# Patient Record
Sex: Female | Born: 1990
Health system: Southern US, Community
[De-identification: ages and names within clinical notes are randomized; demographics above are authoritative.]

## PROBLEM LIST (undated history)

## (undated) DIAGNOSIS — K219 Gastro-esophageal reflux disease without esophagitis: Secondary | ICD-10-CM

## (undated) DIAGNOSIS — E785 Hyperlipidemia, unspecified: Secondary | ICD-10-CM

## (undated) DIAGNOSIS — E039 Hypothyroidism, unspecified: Secondary | ICD-10-CM

## (undated) DIAGNOSIS — K5909 Other constipation: Secondary | ICD-10-CM

## (undated) DIAGNOSIS — D649 Anemia, unspecified: Secondary | ICD-10-CM

## (undated) HISTORY — DX: Anemia, unspecified: D64.9

## (undated) HISTORY — DX: Hyperlipidemia, unspecified: E78.5

## (undated) HISTORY — PX: WISDOM TOOTH EXTRACTION: SHX21

## (undated) HISTORY — DX: Other constipation: K59.09

## (undated) HISTORY — PX: TONSILLECTOMY: SUR1361

## (undated) HISTORY — DX: Gastro-esophageal reflux disease without esophagitis: K21.9

## (undated) HISTORY — DX: Hypothyroidism, unspecified: E03.9

---

## 2019-04-20 ENCOUNTER — Encounter: Payer: Self-pay | Admitting: Gastroenterology

## 2019-04-26 ENCOUNTER — Other Ambulatory Visit: Payer: Self-pay

## 2019-04-26 ENCOUNTER — Ambulatory Visit (INDEPENDENT_AMBULATORY_CARE_PROVIDER_SITE_OTHER): Payer: Commercial Managed Care - PPO | Admitting: Gastroenterology

## 2019-04-26 ENCOUNTER — Encounter (INDEPENDENT_AMBULATORY_CARE_PROVIDER_SITE_OTHER): Payer: Self-pay

## 2019-04-26 ENCOUNTER — Encounter: Payer: Self-pay | Admitting: Gastroenterology

## 2019-04-26 VITALS — BP 96/60 | HR 84 | Temp 98.7°F | Ht 64.75 in | Wt 132.1 lb

## 2019-04-26 DIAGNOSIS — Z800 Family history of malignant neoplasm of digestive organs: Secondary | ICD-10-CM

## 2019-04-26 NOTE — Patient Instructions (Signed)
If you are age 29 or younger, your body mass index should be between 19-25. Your Body mass index is 22.16 kg/m. If this is out of the aformentioned range listed, please consider follow up with your Primary Care Provider.   You have been referred for Genetic Testing. Their office will contact you with an appointment.   Thank you for choosing me and [facility].  Dr. Ardis Hughs

## 2019-04-26 NOTE — Progress Notes (Signed)
HPI: This is a very pleasant 28 year old woman who was referred to me by Fanny Bien, MD  to evaluate family history of colon cancer.    Chief complaint is family history of colon cancer  3 of her grandparents had colon cancer in their 33s.  Her brother has had colon polyps in his 60s.  She herself underwent a colonoscopy about 10 years ago and was told it was normal.  She has very mild chronic constipation for which MiraLAX on a daily basis works quite well.  Stable weight, no overt GI bleeding  Review of systems: Pertinent positive and negative review of systems were noted in the above HPI section. All other review negative.   Past Medical History:  Diagnosis Date  . Anemia   . Chronic constipation   . GERD (gastroesophageal reflux disease)   . HLD (hyperlipidemia)   . Hypothyroidism     Past Surgical History:  Procedure Laterality Date  . TONSILLECTOMY    . WISDOM TOOTH EXTRACTION      Current Outpatient Medications  Medication Sig Dispense Refill  . JUNEL FE 1.5/30 1.5-30 MG-MCG tablet Take 1 tablet by mouth daily.    Marland Kitchen levothyroxine (SYNTHROID) 50 MCG tablet Take 50 mcg by mouth daily before breakfast.    . Multiple Vitamin (MULTIVITAMIN) tablet Take 1 tablet by mouth daily.    . polyethylene glycol (MIRALAX / GLYCOLAX) 17 g packet Take 17 g by mouth daily as needed.     No current facility-administered medications for this visit.     Allergies as of 04/26/2019  . (No Known Allergies)    Family History  Problem Relation Age of Onset  . Hyperlipidemia Mother   . Hyperlipidemia Father   . Hyperlipidemia Brother   . Ovarian cancer Paternal Aunt   . Colon cancer Maternal Grandmother   . Colon cancer Paternal Grandmother   . Colon cancer Paternal Grandfather     Social History   Socioeconomic History  . Marital status: Married    Spouse name: Not on file  . Number of children: Not on file  . Years of education: Not on file  . Highest education level:  Not on file  Occupational History  . Not on file  Social Needs  . Financial resource strain: Not on file  . Food insecurity    Worry: Not on file    Inability: Not on file  . Transportation needs    Medical: Not on file    Non-medical: Not on file  Tobacco Use  . Smoking status: Never Smoker  . Smokeless tobacco: Never Used  Substance and Sexual Activity  . Alcohol use: Never    Frequency: Never  . Drug use: Never  . Sexual activity: Yes  Lifestyle  . Physical activity    Days per week: Not on file    Minutes per session: Not on file  . Stress: Not on file  Relationships  . Social Herbalist on phone: Not on file    Gets together: Not on file    Attends religious service: Not on file    Active member of club or organization: Not on file    Attends meetings of clubs or organizations: Not on file    Relationship status: Not on file  . Intimate partner violence    Fear of current or ex partner: Not on file    Emotionally abused: Not on file    Physically abused: Not on file  Forced sexual activity: Not on file  Other Topics Concern  . Not on file  Social History Narrative  . Not on file     Physical Exam: Temp 98.7 F (37.1 C)   Ht 5' 4.75" (1.645 m) Comment: height measured without shoes  Wt 132 lb 2 oz (59.9 kg)   BMI 22.16 kg/m  Constitutional: generally well-appearing Psychiatric: alert and oriented x3 Eyes: extraocular movements intact Mouth: oral pharynx moist, no lesions Neck: supple no lymphadenopathy Cardiovascular: heart regular rate and rhythm Lungs: clear to auscultation bilaterally Abdomen: soft, nontender, nondistended, no obvious ascites, no peritoneal signs, normal bowel sounds Extremities: no lower extremity edema bilaterally Skin: no lesions on visible extremities   Assessment and plan: 28 y.o. female with family history of colon cancer  She actually had a panel of genetic tests through her gynecologist recently and was told  everything looked fine.  I am not sure if she is personally at increased risk for colon cancer given her family history.  I am going to refer her to genetic counselors to try to help determine the answer to that.  She knows when she has that appointment she should bring her the test results from her gynecologist ordered genetic tests.    Please see the "Patient Instructions" section for addition details about the plan.   Rob Bunting, MD [facility] 04/26/2019, 2:45 PM  Cc: Lewis Moccasin, MD

## 2019-04-28 ENCOUNTER — Telehealth: Payer: Self-pay | Admitting: Genetic Counselor

## 2019-04-28 NOTE — Telephone Encounter (Signed)
I cld and scheduled Holly Terry to see Sophonie for genetic counseling on 12/31 at 3pm. Pt aware to arrive 15 minutes early.

## 2019-05-05 ENCOUNTER — Telehealth: Payer: Self-pay

## 2019-05-05 ENCOUNTER — Telehealth: Payer: Self-pay | Admitting: Licensed Clinical Social Worker

## 2019-05-05 ENCOUNTER — Encounter: Payer: Self-pay | Admitting: Licensed Clinical Social Worker

## 2019-05-05 DIAGNOSIS — Z1379 Encounter for other screening for genetic and chromosomal anomalies: Secondary | ICD-10-CM | POA: Insufficient documentation

## 2019-05-05 NOTE — Telephone Encounter (Signed)
The pt has bene advised and recall in Epic for appt at 39.

## 2019-05-05 NOTE — Telephone Encounter (Signed)
Patient called with questions about upcoming genetic counseling appointment scheduled 05/18/2019. She had genetic testing recently through [facility] and wanted to know if there was anything else she needed to be tested for. She sent a copy and the results were reviewed, snip below for reference. Patient was notified that we could do further testing but that what she has had was fairly comprehensive and did cover genes associated with colon polyps/cancer. She wished to cancel her appointments with [place].

## 2019-05-05 NOTE — Telephone Encounter (Signed)
-----   Message from Milus Banister, MD sent at 05/05/2019 10:09 AM EST ----- Regarding: RE: Olevia Bowens, Thanks. I'll plan on colonoscopy when she turns 39  but not sooner than that.  Thanks  Lillionna Nabi, Can you contact her to let her know that she needs colonoscopy at age 28, please turn this to a phone note as well for record keeping.  Thanks ----- Message ----- From: Eyvonne Mechanic Sent: 05/05/2019  10:01 AM EST To: Milus Banister, MD Subject: Genetics                                       Hi Dr. Ardis Hughs!  This patient was scheduled to see [place] and contacted me with questions about her appt-- she actually already had genetic testing through [facility] in November of this year that was negative which I reviewed, and let her know we wouldn't need to do any other testing. She wanted me to let you know that is why we are cancelling her appointment with [place].  Thanks!! Denton Ar

## 2019-05-18 ENCOUNTER — Other Ambulatory Visit: Payer: Commercial Managed Care - PPO

## 2019-05-18 ENCOUNTER — Encounter: Payer: Commercial Managed Care - PPO | Admitting: Genetic Counselor

## 2019-07-07 MED FILL — LARIN FE 1.5-30 TABLET: 1.5-30 | 84 days supply | Qty: 84 | Fill #0

## 2019-07-14 DIAGNOSIS — E782 Mixed hyperlipidemia: Secondary | ICD-10-CM | POA: Diagnosis not present

## 2019-07-14 DIAGNOSIS — D649 Anemia, unspecified: Secondary | ICD-10-CM | POA: Diagnosis not present

## 2019-07-14 DIAGNOSIS — R5383 Other fatigue: Secondary | ICD-10-CM | POA: Diagnosis not present

## 2019-07-14 DIAGNOSIS — E559 Vitamin D deficiency, unspecified: Secondary | ICD-10-CM | POA: Diagnosis not present

## 2019-07-18 DIAGNOSIS — Z3041 Encounter for surveillance of contraceptive pills: Secondary | ICD-10-CM | POA: Diagnosis not present

## 2019-07-18 DIAGNOSIS — D509 Iron deficiency anemia, unspecified: Secondary | ICD-10-CM | POA: Diagnosis not present

## 2019-07-18 DIAGNOSIS — E039 Hypothyroidism, unspecified: Secondary | ICD-10-CM | POA: Diagnosis not present

## 2019-07-18 DIAGNOSIS — E782 Mixed hyperlipidemia: Secondary | ICD-10-CM | POA: Diagnosis not present

## 2019-07-21 MED FILL — LEVOTHYROXINE 50 MCG TABLET: 50 | 90 days supply | Qty: 90 | Fill #0

## 2019-10-03 MED FILL — BLISOVI FE 1.5/30 1.5-30 MG: 1.5-30 | 84 days supply | Qty: 84 | Fill #1

## 2019-10-30 DIAGNOSIS — Z0000 Encounter for general adult medical examination without abnormal findings: Secondary | ICD-10-CM | POA: Diagnosis not present

## 2019-10-30 DIAGNOSIS — Z0184 Encounter for antibody response examination: Secondary | ICD-10-CM | POA: Diagnosis not present

## 2019-11-01 DIAGNOSIS — Z23 Encounter for immunization: Secondary | ICD-10-CM | POA: Diagnosis not present

## 2019-11-01 DIAGNOSIS — E039 Hypothyroidism, unspecified: Secondary | ICD-10-CM | POA: Diagnosis not present

## 2019-11-01 DIAGNOSIS — K5900 Constipation, unspecified: Secondary | ICD-10-CM | POA: Diagnosis not present

## 2019-11-01 DIAGNOSIS — E782 Mixed hyperlipidemia: Secondary | ICD-10-CM | POA: Diagnosis not present

## 2019-11-01 DIAGNOSIS — L811 Chloasma: Secondary | ICD-10-CM | POA: Diagnosis not present

## 2019-11-01 DIAGNOSIS — Z0000 Encounter for general adult medical examination without abnormal findings: Secondary | ICD-10-CM | POA: Diagnosis not present

## 2019-11-02 MED FILL — LEVOTHYROXINE 50 MCG TABLET: 50 | 90 days supply | Qty: 90 | Fill #1

## 2019-11-14 DIAGNOSIS — H5213 Myopia, bilateral: Secondary | ICD-10-CM | POA: Diagnosis not present

## 2020-01-30 MED FILL — LEVOTHYROXINE 50 MCG TABLET: 50 | 90 days supply | Qty: 90 | Fill #2

## 2020-02-19 DIAGNOSIS — E782 Mixed hyperlipidemia: Secondary | ICD-10-CM | POA: Diagnosis not present

## 2020-02-19 DIAGNOSIS — E559 Vitamin D deficiency, unspecified: Secondary | ICD-10-CM | POA: Diagnosis not present

## 2020-02-19 DIAGNOSIS — R946 Abnormal results of thyroid function studies: Secondary | ICD-10-CM | POA: Diagnosis not present

## 2020-02-22 ENCOUNTER — Other Ambulatory Visit (HOSPITAL_COMMUNITY): Payer: Self-pay | Admitting: Family Medicine

## 2020-02-22 DIAGNOSIS — K5900 Constipation, unspecified: Secondary | ICD-10-CM | POA: Diagnosis not present

## 2020-02-22 DIAGNOSIS — E782 Mixed hyperlipidemia: Secondary | ICD-10-CM | POA: Diagnosis not present

## 2020-02-22 DIAGNOSIS — E559 Vitamin D deficiency, unspecified: Secondary | ICD-10-CM | POA: Diagnosis not present

## 2020-02-22 DIAGNOSIS — E039 Hypothyroidism, unspecified: Secondary | ICD-10-CM | POA: Diagnosis not present

## 2020-02-22 DIAGNOSIS — L811 Chloasma: Secondary | ICD-10-CM | POA: Diagnosis not present

## 2020-04-01 DIAGNOSIS — Z01419 Encounter for gynecological examination (general) (routine) without abnormal findings: Secondary | ICD-10-CM | POA: Diagnosis not present

## 2020-04-01 DIAGNOSIS — Z6821 Body mass index (BMI) 21.0-21.9, adult: Secondary | ICD-10-CM | POA: Diagnosis not present

## 2020-04-23 MED FILL — LEVOTHYROXINE 50 MCG TABLET: 50 | 90 days supply | Qty: 90 | Fill #3

## 2020-05-14 ENCOUNTER — Ambulatory Visit (INDEPENDENT_AMBULATORY_CARE_PROVIDER_SITE_OTHER): Payer: 59 | Admitting: Nurse Practitioner

## 2020-05-14 ENCOUNTER — Encounter: Payer: Self-pay | Admitting: Nurse Practitioner

## 2020-05-14 VITALS — BP 86/60 | HR 72 | Ht 64.75 in | Wt 134.0 lb

## 2020-05-14 DIAGNOSIS — R140 Abdominal distension (gaseous): Secondary | ICD-10-CM | POA: Diagnosis not present

## 2020-05-14 DIAGNOSIS — K5900 Constipation, unspecified: Secondary | ICD-10-CM

## 2020-05-14 NOTE — Progress Notes (Signed)
I agree with the above note, plan 

## 2020-05-14 NOTE — Patient Instructions (Signed)
If you are age 30 or older, your body mass index should be between 23-30. Your Body mass index is 22.47 kg/m. If this is out of the aforementioned range listed, please consider follow up with your Primary Care Provider.  If you are age 47 or younger, your body mass index should be between 19-25. Your Body mass index is 22.47 kg/m. If this is out of the aformentioned range listed, please consider follow up with your Primary Care Provider.   Please purchase the following medications over the counter and take as directed:      1. Miralax before bedtime 2. Dulcolax 1 tablet 3 nights a week as needed.  Please call our office( or send MyChart message)  in 2 weeks with an update or earlier is symptoms worsen.    We will request your recent blood work from your primary care provider.  It was great seeing you today!  Thank you for entrusting me with your care and choosing [facility].  Alcide Evener, NP

## 2020-05-14 NOTE — Progress Notes (Addendum)
05/14/2020 Holly Terry [id number] [birth year]   Chief Complaint: Chronic constipation  History of Present Illness:  Holly Terry is a 29 year old female with a past medical history of chronic constipation.  She presents today for further evaluation.  She is passing a small brown formed bowel movement most days and passing a larger well-formed stool 2 or 3 times weekly when she actually feels emptied.  She complains of having abdominal bloat upon awakening in the morning on a daily basis.  She has generalized abdominal discomfort which sometimes feels like she has rocks inside.  She had severe centralized abdominal pain almost buckled her over about 1 week ago without recurrence.  She infrequently has left lower quadrant discomfort if she strains to pass a BM.  She was previously taking MiraLAX 1 capful once daily then increase to twice daily without improvement.  She felt more bloated when taking MiraLAX twice daily so she went back to once daily dosing.  She reported taking Linzess several years ago which resulted in 3 hours of nonstop diarrhea and it was not covered by her insurance therefore she is not interested in retrying this medication.  She thinks she tried Amitiza 10 years ago without improvement.  No rectal bleeding.  No mucus per the rectum.  She eats a high-fiber mostly vegan diet.  She drinks 80 ounces of water daily.  He was last seen by her gynecologist in the fall 2021 reported pelvic exam was normal at that time.  Her brother was diagnosed with precancerous colon polyps in his early 58s.  Three grandparents were diagnosed with colon cancer in their 33s.  She reported undergoing a colonoscopy about 10 years ago which was normal.  She was previously seen in the office by Dr. Christella Hartigan 04/26/2019, at that time he recommended a colonoscopy at the age of 77.  She underwent genetic testing through her gynecologist which was negative.  She questions if she should undergo a colonoscopy at this  time.  No GERD symptoms.  She reported undergoing laboratory studies a few months ago included a TSH level by her PCP which were normal.  I will request a copy of these lab results for further review.   Current Outpatient Medications on File Prior to Visit  Medication Sig Dispense Refill  . levothyroxine (SYNTHROID) 50 MCG tablet Take 50 mcg by mouth daily before breakfast.    . polyethylene glycol (MIRALAX / GLYCOLAX) 17 g packet Take 17 g by mouth daily as needed.     No current facility-administered medications on file prior to visit.   No Known Allergies  Current Medications, Allergies, Past Medical History, Past Surgical History, Family History and Social History were reviewed in [name] record.   Review of Systems:   Constitutional: Negative for fever, sweats, chills or weight loss.  Respiratory: Negative for shortness of breath.   Cardiovascular: Negative for chest pain, palpitations and leg swelling.  Gastrointestinal: See HPI.  Musculoskeletal: Negative for back pain or muscle aches.  Neurological: Negative for dizziness, headaches or paresthesias.    Physical Exam: BP (!) 86/60 (BP Location: Left Arm, Patient Position: Sitting, Cuff Size: Normal)   Pulse 72   Ht 5' 4.75" (1.645 m)   Wt 134 lb (60.8 kg)   LMP 04/16/2020   BMI 22.47 kg/m  General: Well developed 29 year old female in no acute distress. Head: Normocephalic and atraumatic. Eyes: No scleral icterus. Conjunctiva pink . Ears: Normal auditory acuity. Mouth: Dentition intact. No ulcers or  lesions.  Lungs: Clear throughout to auscultation. Heart: Regular rate and rhythm, no murmur. Abdomen: Soft, nontender and nondistended. No masses or hepatomegaly. Normal bowel sounds x 4 quadrants.  Rectal: Deferred. Musculoskeletal: Symmetrical with no gross deformities. Extremities: No edema. Neurological: Alert oriented x 4. No focal deficits.  Psychological: Alert and cooperative. Normal  mood and affect  Assessment and Recommendations:  96.  30 year old female with chronic constipation and abdominal bloat.  -She does not wish to try low-dose Linzess or Trulance -Continue MiraLAX once daily and add Dulcolax 1-2 tabs every third night. -Patient to call me in 2 weeks with an update -Discussed checking TTG and IgA level with next blood draw  2.  Generalized periumbilical pain.  Negative abdominal exam today.  No current abdominal pain. -Discussed scheduling abdominal/pelvic CT scan if significant central abdominal pain or lower abdominal pain occurs -Request copy of most recent CBC, CMP and TSH from PCPs office  3.Family history of colon cancer (3 grandparents in their 8's).  Brother with history of colon polyps in his early 36's. Normal colonoscopy 10 years ago.  -Colonoscopy recommended at age 63 -Patient to schedule a follow up appointment with Dr. Christella Hartigan if her symptoms persist as she wishes to schedule a near future colonoscopy    ADDENDUM: LABS 02/19/2020 RECEIVED: TSH 1.980. Vitamin D 47.

## 2020-05-18 NOTE — L&D Delivery Note (Signed)
Operative Delivery Note At 8:00 AM a viable female was delivered via Vaginal, [employer], operational).  Presentation: vertex; Position: Right,, Occiput,, Anterior; Station: +2.  Verbal consent: obtained from patient.  Risks and benefits discussed in detail.  Risks include, but are not limited to the risks of anesthesia, bleeding, infection, damage to maternal tissues, fetal cephalhematoma.  There is also the risk of inability to effect vaginal delivery of the head, or shoulder dystocia that cannot be resolved by established maneuvers, leading to the need for emergency cesarean section.  APGAR: 8, 9; weight  pending.   Placenta status: spontaneously with 3 vessel cord, .   Cord:  with the following complications: none.  Cord pH: not obtained  Anesthesia:  local Instruments: mushroom Episiotomy: Median Lacerations: 2nd degree Suture Repair: 3.0 chromic Est. Blood Loss (mL): 500  Mom to postpartum.  Baby to Couplet care / Skin to Skin.  Jeani Hawking 02/26/2021, 8:25 AM

## 2020-06-26 ENCOUNTER — Other Ambulatory Visit: Payer: Self-pay

## 2020-06-26 ENCOUNTER — Emergency Department (INDEPENDENT_AMBULATORY_CARE_PROVIDER_SITE_OTHER)
Admission: RE | Admit: 2020-06-26 | Discharge: 2020-06-26 | Disposition: A | Discharge status: Home / Self Care | Payer: 59 | Source: Ambulatory Visit

## 2020-06-26 ENCOUNTER — Emergency Department (INDEPENDENT_AMBULATORY_CARE_PROVIDER_SITE_OTHER): Payer: 59

## 2020-06-26 ENCOUNTER — Other Ambulatory Visit: Payer: Self-pay | Admitting: Family Medicine

## 2020-06-26 VITALS — BP 111/73 | HR 82 | Temp 98.3°F | Resp 18

## 2020-06-26 DIAGNOSIS — S86911A Strain of unspecified muscle(s) and tendon(s) at lower leg level, right leg, initial encounter: Secondary | ICD-10-CM | POA: Diagnosis not present

## 2020-06-26 DIAGNOSIS — M25561 Pain in right knee: Secondary | ICD-10-CM

## 2020-06-26 MED ORDER — DICLOFENAC SODIUM 50 MG PO TBEC: 50.0000 mg | DELAYED_RELEASE_TABLET | Freq: Two times a day (BID) | ORAL | 0 refills | Status: DC

## 2020-06-26 MED FILL — DICLOFENAC SOD EC 50 MG TAB: 50 | 10 days supply | Qty: 20 | Fill #0

## 2020-06-26 NOTE — ED Provider Notes (Signed)
[facility]    CSN: [id number] Arrival date & time: 06/26/20  0940      History   Chief Complaint Chief Complaint  Patient presents with  . Knee Pain    RT    HPI Holly Terry is a 30 y.o. female.   HPI  Patient presents today for evaluation of right knee pain related to possible injury while jump roping yesterday.  Patient reports that while jump roping she did come down her right extremity a little harder than normal although denies knowledge of twisting or the sensation of any popping occurring within the knee.  After her workouts she did note mild pain applied ice to the knee and took an ibuprofen.  Upon awakening this morning she is having limited range of motion in the right knee with flexion, extension and some swelling at the patellar tendon. She is here for evaluation of injury.   Past Medical History:  Diagnosis Date  . Anemia   . Chronic constipation   . GERD (gastroesophageal reflux disease)   . HLD (hyperlipidemia)   . Hypothyroidism     Patient Active Problem List   Diagnosis Date Noted  . Constipation 05/14/2020  . Genetic testing 05/05/2019    Past Surgical History:  Procedure Laterality Date  . TONSILLECTOMY    . WISDOM TOOTH EXTRACTION      OB History   No obstetric history on file.      Home Medications    Prior to Admission medications   Medication Sig Start Date End Date Taking? Authorizing Provider  levothyroxine (SYNTHROID) 50 MCG tablet Take 50 mcg by mouth daily before breakfast.    [provider]  polyethylene glycol (MIRALAX / GLYCOLAX) 17 g packet Take 17 g by mouth daily as needed.    [provider]    Family History Family History  Problem Relation Age of Onset  . Hyperlipidemia Mother   . Hyperlipidemia Father   . Throat cancer Father   . Hyperlipidemia Brother   . Ovarian cancer Maternal Aunt   . Colon cancer Maternal Grandmother   . Colon cancer Paternal Grandmother   . Heart disease  Paternal Grandmother   . Colon cancer Paternal Grandfather   . Colon polyps Brother     Social History Social History   Tobacco Use  . Smoking status: Never Smoker  . Smokeless tobacco: Never Used  Vaping Use  . Vaping Use: Never used  Substance Use Topics  . Alcohol use: Never  . Drug use: Never     Allergies   Patient has no known allergies.   Review of Systems Review of Systems Pertinent negatives listed in HPI  Physical Exam Triage Vital Signs ED Triage Vitals  Enc Vitals Group     BP 06/26/20 0950 111/73     Pulse Rate 06/26/20 0950 82     Resp 06/26/20 0950 18     Temp 06/26/20 0950 98.3 F (36.8 C)     Temp Source 06/26/20 0950 Oral     SpO2 06/26/20 0950 100 %     Weight --      Height --      Head Circumference --      Peak Flow --      Pain Score 06/26/20 0952 4     Pain Loc --      Pain Edu? --      Excl. in GC? --    No data found.  Updated Vital Signs BP  111/73 (BP Location: Left Arm)   Pulse 82   Temp 98.3 F (36.8 C) (Oral)   Resp 18   LMP 05/25/2020 (Exact Date)   SpO2 100%   Visual Acuity Right Eye Distance:   Left Eye Distance:   Bilateral Distance:    Right Eye Near:   Left Eye Near:    Bilateral Near:     Physical Exam Constitutional:      Appearance: Normal appearance. She is not toxic-appearing.  HENT:     Head: Normocephalic.  Cardiovascular:     Rate and Rhythm: Normal rate and regular rhythm.  Pulmonary:     Effort: Pulmonary effort is normal.     Breath sounds: Normal breath sounds.  Musculoskeletal:     Right knee: Swelling present. Decreased range of motion. Tenderness present over the patellar tendon. Normal patellar mobility.  Skin:    Capillary Refill: Capillary refill takes less than 2 seconds.  Neurological:     General: No focal deficit present.     Mental Status: She is alert and oriented to person, place, and time.  Psychiatric:        Mood and Affect: Mood normal.        Thought Content: Thought  content normal.        Judgment: Judgment normal.      UC Treatments / Results  Labs (all labs ordered are listed, but only abnormal results are displayed) Labs Reviewed - No data to display  EKG   Radiology DG Knee Complete 4 Views Right  Result Date: 06/26/2020 CLINICAL DATA:  Right knee pain. EXAM: RIGHT KNEE - COMPLETE 4+ VIEW COMPARISON:  None. FINDINGS: No evidence of fracture, dislocation, or joint effusion. No evidence of arthropathy or other focal bone abnormality. Soft tissues are unremarkable. IMPRESSION: Negative. Electronically Signed   By: Irish Lack M.D.   On: 06/26/2020 10:37    Procedures Procedures (including critical care time)  Medications Ordered in UC Medications - No data to display  Initial Impression / Assessment and Plan / UC Course  I have reviewed the triage vital signs and the nursing notes.  Pertinent labs & imaging results that were available during my care of the patient were reviewed by me and considered in my medical decision making (see chart for details).      Patient presents today with right knee injury following a workout routine which she was jumping rope.  Imaging is negative for any effusion or fracture.  Recommended conservative treatment for a right knee strain including RICE and NSAIDs.  Information provided to follow-up with sports medicine if symptoms do not improve completely or if symptoms continue outside of the 5-day mark. Patient verbalized understanding and agreement with today's plan. Final Clinical Impressions(s) / UC Diagnoses   Final diagnoses:  Strain of right knee, initial encounter     Discharge Instructions     Start diclofenac 50 mg twice daily consistently for the next 48 hours, elevate knee , continue ice at least 3-4 times a day, and also recommend wrapping with an Ace wrap or a knee brace for compression to reduce swelling. Symptoms should gradually improve over the course of the next 3 to 5 days.  If  symptoms have not improved or any point worsen would recommend following up with our sports medicine provider Dr. Jordan Likes, contact information listed on discharge summary.    ED Prescriptions    Medication Sig Dispense Auth. Provider   diclofenac (VOLTAREN) 50 MG EC tablet Take  1 tablet (50 mg total) by mouth 2 (two) times daily. 20 tablet Bing Neighbors, FNP     PDMP not reviewed this encounter.   Bing Neighbors, FNP 06/26/20 1056

## 2020-06-26 NOTE — Discharge Instructions (Addendum)
Start diclofenac 50 mg twice daily consistently for the next 48 hours, elevate knee , continue ice at least 3-4 times a day, and also recommend wrapping with an Ace wrap or a knee brace for compression to reduce swelling. Symptoms should gradually improve over the course of the next 3 to 5 days.  If symptoms have not improved or any point worsen would recommend following up with our sports medicine provider Dr. Jordan Likes, contact information listed on discharge summary.

## 2020-06-26 NOTE — ED Triage Notes (Signed)
Pt c/o RT knee pain since last night. Says she was jump roping last night. Woke up to knee swelling/stiffness and trouble weight bearing. Pain 4/10 Ice and Ibuprofen last night.

## 2020-07-18 DIAGNOSIS — N911 Secondary amenorrhea: Secondary | ICD-10-CM | POA: Diagnosis not present

## 2020-07-23 ENCOUNTER — Other Ambulatory Visit (HOSPITAL_COMMUNITY): Payer: Self-pay | Admitting: Family Medicine

## 2020-07-23 MED FILL — LEVOTHYROXINE 50 MCG TABLET: 50 | 90 days supply | Qty: 90 | Fill #0

## 2020-07-29 DIAGNOSIS — Z3481 Encounter for supervision of other normal pregnancy, first trimester: Secondary | ICD-10-CM | POA: Diagnosis not present

## 2020-07-29 DIAGNOSIS — Z31430 Encounter of female for testing for genetic disease carrier status for procreative management: Secondary | ICD-10-CM | POA: Diagnosis not present

## 2020-07-29 DIAGNOSIS — Z3685 Encounter for antenatal screening for Streptococcus B: Secondary | ICD-10-CM | POA: Diagnosis not present

## 2020-08-08 DIAGNOSIS — Z113 Encounter for screening for infections with a predominantly sexual mode of transmission: Secondary | ICD-10-CM | POA: Diagnosis not present

## 2020-08-08 DIAGNOSIS — Z3400 Encounter for supervision of normal first pregnancy, unspecified trimester: Secondary | ICD-10-CM | POA: Diagnosis not present

## 2020-08-09 ENCOUNTER — Other Ambulatory Visit (HOSPITAL_BASED_OUTPATIENT_CLINIC_OR_DEPARTMENT_OTHER): Payer: Self-pay

## 2020-08-20 DIAGNOSIS — Z3481 Encounter for supervision of other normal pregnancy, first trimester: Secondary | ICD-10-CM | POA: Diagnosis not present

## 2020-08-20 DIAGNOSIS — Z3682 Encounter for antenatal screening for nuchal translucency: Secondary | ICD-10-CM | POA: Diagnosis not present

## 2020-08-20 DIAGNOSIS — Z3A12 12 weeks gestation of pregnancy: Secondary | ICD-10-CM | POA: Diagnosis not present

## 2020-10-15 DIAGNOSIS — Z3400 Encounter for supervision of normal first pregnancy, unspecified trimester: Secondary | ICD-10-CM | POA: Diagnosis not present

## 2020-10-15 DIAGNOSIS — Z363 Encounter for antenatal screening for malformations: Secondary | ICD-10-CM | POA: Diagnosis not present

## 2020-10-15 DIAGNOSIS — Z3A20 20 weeks gestation of pregnancy: Secondary | ICD-10-CM | POA: Diagnosis not present

## 2020-10-21 ENCOUNTER — Other Ambulatory Visit (HOSPITAL_COMMUNITY): Payer: Self-pay

## 2020-10-21 MED FILL — Levothyroxine Sodium Tab 50 MCG: ORAL | 90 days supply | Qty: 90 | Fill #0 | Status: AC

## 2020-11-04 DIAGNOSIS — Z0000 Encounter for general adult medical examination without abnormal findings: Secondary | ICD-10-CM | POA: Diagnosis not present

## 2020-11-06 DIAGNOSIS — N3000 Acute cystitis without hematuria: Secondary | ICD-10-CM | POA: Diagnosis not present

## 2020-11-06 DIAGNOSIS — Z0000 Encounter for general adult medical examination without abnormal findings: Secondary | ICD-10-CM | POA: Diagnosis not present

## 2020-11-13 DIAGNOSIS — Z3402 Encounter for supervision of normal first pregnancy, second trimester: Secondary | ICD-10-CM | POA: Diagnosis not present

## 2020-11-13 DIAGNOSIS — Z3A24 24 weeks gestation of pregnancy: Secondary | ICD-10-CM | POA: Diagnosis not present

## 2020-11-13 DIAGNOSIS — Z362 Encounter for other antenatal screening follow-up: Secondary | ICD-10-CM | POA: Diagnosis not present

## 2020-12-02 DIAGNOSIS — Z3A27 27 weeks gestation of pregnancy: Secondary | ICD-10-CM | POA: Diagnosis not present

## 2020-12-02 DIAGNOSIS — Z3400 Encounter for supervision of normal first pregnancy, unspecified trimester: Secondary | ICD-10-CM | POA: Diagnosis not present

## 2020-12-02 DIAGNOSIS — Z3480 Encounter for supervision of other normal pregnancy, unspecified trimester: Secondary | ICD-10-CM | POA: Diagnosis not present

## 2020-12-02 DIAGNOSIS — Z23 Encounter for immunization: Secondary | ICD-10-CM | POA: Diagnosis not present

## 2021-01-23 ENCOUNTER — Other Ambulatory Visit (HOSPITAL_COMMUNITY): Payer: Self-pay

## 2021-01-23 MED ORDER — LEVOTHYROXINE SODIUM 50 MCG PO TABS: ORAL_TABLET | ORAL | 1 refills | Status: DC

## 2021-01-23 MED FILL — Levothyroxine Sodium Tab 50 MCG: ORAL | 90 days supply | Qty: 90 | Fill #0 | Status: AC

## 2021-02-03 DIAGNOSIS — Z3682 Encounter for antenatal screening for nuchal translucency: Secondary | ICD-10-CM | POA: Diagnosis not present

## 2021-02-03 LAB — OB RESULTS CONSOLE GBS: GBS: NEGATIVE

## 2021-02-25 ENCOUNTER — Inpatient Hospital Stay (HOSPITAL_COMMUNITY)
Admission: AD | Admit: 2021-02-25 | Discharge: 2021-02-27 | DRG: 806 | Disposition: A | Discharge status: Home / Self Care | Payer: 59 | Attending: Obstetrics and Gynecology | Admitting: Obstetrics and Gynecology

## 2021-02-25 ENCOUNTER — Encounter (HOSPITAL_COMMUNITY): Payer: Self-pay | Admitting: Obstetrics and Gynecology

## 2021-02-25 ENCOUNTER — Other Ambulatory Visit: Payer: Self-pay

## 2021-02-25 ENCOUNTER — Inpatient Hospital Stay (EMERGENCY_DEPARTMENT_HOSPITAL)
Admission: AD | Admit: 2021-02-25 | Discharge: 2021-02-25 | Disposition: A | Discharge status: Home / Self Care | Payer: 59 | Source: Home / Self Care | Attending: Obstetrics and Gynecology | Admitting: Obstetrics and Gynecology

## 2021-02-25 DIAGNOSIS — O26893 Other specified pregnancy related conditions, third trimester: Secondary | ICD-10-CM | POA: Diagnosis present

## 2021-02-25 DIAGNOSIS — E039 Hypothyroidism, unspecified: Secondary | ICD-10-CM | POA: Diagnosis not present

## 2021-02-25 DIAGNOSIS — D62 Acute posthemorrhagic anemia: Secondary | ICD-10-CM | POA: Diagnosis not present

## 2021-02-25 DIAGNOSIS — O99284 Endocrine, nutritional and metabolic diseases complicating childbirth: Secondary | ICD-10-CM | POA: Diagnosis not present

## 2021-02-25 DIAGNOSIS — O471 False labor at or after 37 completed weeks of gestation: Secondary | ICD-10-CM

## 2021-02-25 DIAGNOSIS — O9081 Anemia of the puerperium: Secondary | ICD-10-CM | POA: Diagnosis not present

## 2021-02-25 DIAGNOSIS — Z3A39 39 weeks gestation of pregnancy: Secondary | ICD-10-CM

## 2021-02-25 DIAGNOSIS — Z20822 Contact with and (suspected) exposure to covid-19: Secondary | ICD-10-CM | POA: Diagnosis not present

## 2021-02-25 DIAGNOSIS — O479 False labor, unspecified: Secondary | ICD-10-CM

## 2021-02-25 DIAGNOSIS — Z3689 Encounter for other specified antenatal screening: Secondary | ICD-10-CM

## 2021-02-25 LAB — CBC
HCT: 33.1 % — ABNORMAL LOW (ref 36.0–46.0)
Hemoglobin: 10.5 g/dL — ABNORMAL LOW (ref 12.0–15.0)
MCH: 26 pg (ref 26.0–34.0)
MCHC: 31.7 g/dL (ref 30.0–36.0)
MCV: 81.9 fL (ref 80.0–100.0)
Platelets: 308 10*3/uL (ref 150–400)
RBC: 4.04 MIL/uL (ref 3.87–5.11)
RDW: 13.8 % (ref 11.5–15.5)
WBC: 13 10*3/uL — ABNORMAL HIGH (ref 4.0–10.5)
nRBC: 0 % (ref 0.0–0.2)

## 2021-02-25 MED ORDER — EPHEDRINE 5 MG/ML INJ: 10.0000 mg | INTRAVENOUS | Status: DC | PRN

## 2021-02-25 MED ORDER — FENTANYL CITRATE (PF) 100 MCG/2ML IJ SOLN
100.0000 ug | INTRAMUSCULAR | Status: DC | PRN
  Administered 2021-02-25: 100 ug via INTRAVENOUS
  Filled 2021-02-25: qty 2

## 2021-02-25 MED ORDER — PHENYLEPHRINE 40 MCG/ML (10ML) SYRINGE FOR IV PUSH (FOR BLOOD PRESSURE SUPPORT): 80.0000 ug | PREFILLED_SYRINGE | INTRAVENOUS | Status: DC | PRN

## 2021-02-25 MED ORDER — FENTANYL-BUPIVACAINE-NACL 0.5-0.125-0.9 MG/250ML-% EP SOLN
EPIDURAL | Status: AC
  Filled 2021-02-25: qty 250

## 2021-02-25 MED ORDER — ACETAMINOPHEN 325 MG PO TABS: 650.0000 mg | ORAL_TABLET | ORAL | Status: DC | PRN

## 2021-02-25 MED ORDER — FENTANYL CITRATE (PF) 100 MCG/2ML IJ SOLN
INTRAMUSCULAR | Status: AC
  Administered 2021-02-26: 100 ug via INTRAVENOUS
  Filled 2021-02-25: qty 2

## 2021-02-25 MED ORDER — ONDANSETRON HCL 4 MG/2ML IJ SOLN: 4.0000 mg | Freq: Four times a day (QID) | INTRAMUSCULAR | Status: DC | PRN

## 2021-02-25 MED ORDER — OXYTOCIN BOLUS FROM INFUSION
333.0000 mL | Freq: Once | INTRAVENOUS | Status: AC
  Administered 2021-02-26: 333 mL via INTRAVENOUS

## 2021-02-25 MED ORDER — OXYCODONE-ACETAMINOPHEN 5-325 MG PO TABS: 2.0000 | ORAL_TABLET | ORAL | Status: DC | PRN

## 2021-02-25 MED ORDER — FENTANYL-BUPIVACAINE-NACL 0.5-0.125-0.9 MG/250ML-% EP SOLN: 12.0000 mL/h | EPIDURAL | Status: DC | PRN

## 2021-02-25 MED ORDER — LACTATED RINGERS IV SOLN
500.0000 mL | Freq: Once | INTRAVENOUS | Status: DC
Start: 2021-02-25 — End: 2021-02-26

## 2021-02-25 MED ORDER — OXYCODONE-ACETAMINOPHEN 5-325 MG PO TABS: 1.0000 | ORAL_TABLET | ORAL | Status: DC | PRN

## 2021-02-25 MED ORDER — OXYTOCIN-SODIUM CHLORIDE 30-0.9 UT/500ML-% IV SOLN
2.5000 [IU]/h | INTRAVENOUS | Status: DC
  Filled 2021-02-25: qty 500

## 2021-02-25 MED ORDER — LACTATED RINGERS IV SOLN: INTRAVENOUS | Status: DC

## 2021-02-25 MED ORDER — LACTATED RINGERS IV SOLN: 500.0000 mL | INTRAVENOUS | Status: DC | PRN

## 2021-02-25 MED ORDER — SOD CITRATE-CITRIC ACID 500-334 MG/5ML PO SOLN: 30.0000 mL | ORAL | Status: DC | PRN

## 2021-02-25 MED ORDER — FLEET ENEMA 7-19 GM/118ML RE ENEM: 1.0000 | ENEMA | RECTAL | Status: DC | PRN

## 2021-02-25 MED ORDER — DIPHENHYDRAMINE HCL 50 MG/ML IJ SOLN: 12.5000 mg | INTRAMUSCULAR | Status: DC | PRN

## 2021-02-25 MED ORDER — LIDOCAINE HCL (PF) 1 % IJ SOLN
30.0000 mL | INTRAMUSCULAR | Status: AC | PRN
  Administered 2021-02-26: 30 mL via SUBCUTANEOUS
  Filled 2021-02-25: qty 30

## 2021-02-25 NOTE — MAU Note (Signed)
.  Holly Terry is a 30 y.o. at [redacted]w[redacted]d here in MAU reporting: ctx that are 3-4 minutes. Having bloody show. Denies LOF. Endorses good fetal movement. GBS neg.   Pain score: 6 Vitals:   02/25/21 0732  BP: 138/73  Pulse: 67  Resp: 16  Temp: 98.1 F (36.7 C)     FHT:135

## 2021-02-25 NOTE — MAU Note (Signed)
Pt c/o lower abdominal pain x1 day, rates pain 10/10, denies any vaginal bleeding or leakage of fluid, + fetal movement, safety maintained

## 2021-02-25 NOTE — MAU Provider Note (Signed)
None    S: Ms. Deaunna Olarte is a 30 y.o. G1P0 at [redacted]w[redacted]d  who presents to MAU today complaining contractions q 3-4 minutes. She endorses bloody show. She denies LOF. She reports normal fetal movement.    O: BP 127/78 (BP Location: Right Arm)   Pulse 76   Temp 98.2 F (36.8 C) (Oral)   Resp 16   Ht 5\' 5"  (1.651 m)   Wt 71.6 kg   LMP 05/25/2020   SpO2 98%   BMI 26.26 kg/m  GENERAL: Well-developed, well-nourished female in no acute distress.  HEAD: Normocephalic, atraumatic.  CHEST: Normal effort of breathing, regular heart rate ABDOMEN: Soft, nontender, gravid  Cervical exam:  Dilation: 2 Effacement (%): 80 Cervical Position: Posterior Station: Ballotable Presentation: Vertex Exam by:: E.Edwards,RN   Fetal Monitoring: Baseline: 135 Variability: Mod Accelerations: 15 x 15 Decelerations: None Contractions: 3-5   A: SIUP at [redacted]w[redacted]d  False labor Cervix unchanged Dr. [redacted]w[redacted]d inadvertently contacted for labor triage by bedside RN   P: Patient discharged per orders from Dr. Rana Snare, CNM 02/25/2021 1:35 PM

## 2021-02-26 ENCOUNTER — Encounter (HOSPITAL_COMMUNITY): Payer: Self-pay | Admitting: Obstetrics and Gynecology

## 2021-02-26 DIAGNOSIS — Z3A39 39 weeks gestation of pregnancy: Secondary | ICD-10-CM | POA: Diagnosis not present

## 2021-02-26 LAB — TYPE AND SCREEN
ABO/RH(D): B POS
Antibody Screen: NEGATIVE

## 2021-02-26 LAB — RESP PANEL BY RT-PCR (FLU A&B, COVID) ARPGX2
Influenza A by PCR: NEGATIVE
Influenza B by PCR: NEGATIVE
SARS Coronavirus 2 by RT PCR: NEGATIVE

## 2021-02-26 LAB — RPR: RPR Ser Ql: NONREACTIVE

## 2021-02-26 MED ORDER — ACETAMINOPHEN 325 MG PO TABS
650.0000 mg | ORAL_TABLET | ORAL | Status: DC | PRN
  Administered 2021-02-26 – 2021-02-27 (×3): 650 mg via ORAL
  Filled 2021-02-26 (×3): qty 2

## 2021-02-26 MED ORDER — PRENATAL MULTIVITAMIN CH
1.0000 | ORAL_TABLET | Freq: Every day | ORAL | Status: DC
  Administered 2021-02-27: 1 via ORAL
  Filled 2021-02-26 (×2): qty 1

## 2021-02-26 MED ORDER — MEASLES, MUMPS & RUBELLA VAC IJ SOLR: 0.5000 mL | Freq: Once | INTRAMUSCULAR | Status: DC

## 2021-02-26 MED ORDER — DIPHENHYDRAMINE HCL 25 MG PO CAPS: 25.0000 mg | ORAL_CAPSULE | Freq: Four times a day (QID) | ORAL | Status: DC | PRN

## 2021-02-26 MED ORDER — TERBUTALINE SULFATE 1 MG/ML IJ SOLN
INTRAMUSCULAR | Status: AC
  Administered 2021-02-26: 0.25 mg
  Filled 2021-02-26: qty 1

## 2021-02-26 MED ORDER — FLEET ENEMA 7-19 GM/118ML RE ENEM: 1.0000 | ENEMA | Freq: Every day | RECTAL | Status: DC | PRN

## 2021-02-26 MED ORDER — DIBUCAINE (PERIANAL) 1 % EX OINT: 1.0000 "application " | TOPICAL_OINTMENT | CUTANEOUS | Status: DC | PRN

## 2021-02-26 MED ORDER — SENNOSIDES-DOCUSATE SODIUM 8.6-50 MG PO TABS
2.0000 | ORAL_TABLET | Freq: Every day | ORAL | Status: DC
  Administered 2021-02-27: 2 via ORAL
  Filled 2021-02-26: qty 2

## 2021-02-26 MED ORDER — SIMETHICONE 80 MG PO CHEW: 80.0000 mg | CHEWABLE_TABLET | ORAL | Status: DC | PRN

## 2021-02-26 MED ORDER — IBUPROFEN 600 MG PO TABS
600.0000 mg | ORAL_TABLET | Freq: Four times a day (QID) | ORAL | Status: DC
  Administered 2021-02-26 – 2021-02-27 (×5): 600 mg via ORAL
  Filled 2021-02-26 (×5): qty 1

## 2021-02-26 MED ORDER — TETANUS-DIPHTH-ACELL PERTUSSIS 5-2.5-18.5 LF-MCG/0.5 IM SUSY: 0.5000 mL | PREFILLED_SYRINGE | Freq: Once | INTRAMUSCULAR | Status: DC

## 2021-02-26 MED ORDER — ONDANSETRON HCL 4 MG PO TABS: 4.0000 mg | ORAL_TABLET | ORAL | Status: DC | PRN

## 2021-02-26 MED ORDER — BISACODYL 10 MG RE SUPP: 10.0000 mg | Freq: Every day | RECTAL | Status: DC | PRN

## 2021-02-26 MED ORDER — ONDANSETRON HCL 4 MG/2ML IJ SOLN: 4.0000 mg | INTRAMUSCULAR | Status: DC | PRN

## 2021-02-26 MED ORDER — COCONUT OIL OIL: 1.0000 "application " | TOPICAL_OIL | Status: DC | PRN

## 2021-02-26 MED ORDER — BENZOCAINE-MENTHOL 20-0.5 % EX AERO
1.0000 "application " | INHALATION_SPRAY | CUTANEOUS | Status: DC | PRN
  Administered 2021-02-26: 1 via TOPICAL
  Filled 2021-02-26: qty 56

## 2021-02-26 MED ORDER — ZOLPIDEM TARTRATE 5 MG PO TABS: 5.0000 mg | ORAL_TABLET | Freq: Every evening | ORAL | Status: DC | PRN

## 2021-02-26 MED ORDER — OXYCODONE HCL 5 MG PO TABS: 5.0000 mg | ORAL_TABLET | ORAL | Status: DC | PRN

## 2021-02-26 MED ORDER — WITCH HAZEL-GLYCERIN EX PADS: 1.0000 "application " | MEDICATED_PAD | CUTANEOUS | Status: DC | PRN

## 2021-02-26 MED ORDER — MEDROXYPROGESTERONE ACETATE 150 MG/ML IM SUSP: 150.0000 mg | INTRAMUSCULAR | Status: DC | PRN

## 2021-02-26 MED ORDER — OXYCODONE HCL 5 MG PO TABS: 10.0000 mg | ORAL_TABLET | ORAL | Status: DC | PRN

## 2021-02-26 NOTE — Lactation Note (Signed)
This note was copied from a baby's chart. Lactation Consultation Note  Patient Name: Holly Terry WIOMB'T Date: 02/26/2021 Reason for consult: Initial assessment;Term;[place] Age:31 hours  Mom holding infant STS.  Mom had questions regarding waking, feeding, latching.  All answered.    Infant was placed in cross cradle and latched taking a few sucks then stopped.  Mom felt it seemed difficult.  LC placed infant in laid back positioned and infant rooted to latch.  LC assisted with guiding baby to the breast.  Infant fed with continuous and rhythmic sucking. Infant came off after several minutes then relatched with no assistance from [facility].    Mom is aware of OP LC services, support groups, and phone line.    Maternal Data Has patient been taught Hand Expression?: Yes Does the patient have breastfeeding experience prior to this delivery?: No  Feeding Mother's Current Feeding Choice: Breast Milk  LATCH Score Latch: Grasps breast easily, tongue down, lips flanged, rhythmical sucking.  Audible Swallowing: A few with stimulation  Type of Nipple: Everted at rest and after stimulation  Comfort (Breast/Nipple): Soft / non-tender  Hold (Positioning): Assistance needed to correctly position infant at breast and maintain latch.  LATCH Score: 8   Lactation Tools Discussed/Used    Interventions Interventions: Assisted with latch;Skin to skin;Support pillows;Education  Discharge Pump: Employee Pump (mom will be receiving an employee pump)  Consult Status Consult Status: Follow-up Date: 02/27/21 Follow-up type: In-patient    Maryruth Hancock [facility] 02/26/2021, 11:51 AM

## 2021-02-26 NOTE — H&P (Signed)
Holly Terry is a 30 y.o. female presenting for active labor and now with SROM clear fluid.  Pregnancy uncomplicated. GBS -. OB History     Gravida  1   Para      Term      Preterm      AB      Living         SAB      IAB      Ectopic      Multiple      Live Births             Past Medical History:  Diagnosis Date   Anemia    Chronic constipation    GERD (gastroesophageal reflux disease)    HLD (hyperlipidemia)    Hypothyroidism    Past Surgical History:  Procedure Laterality Date   TONSILLECTOMY     WISDOM TOOTH EXTRACTION     Family History: family history includes Colon cancer in her maternal grandmother, paternal grandfather, and paternal grandmother; Colon polyps in her brother; Heart disease in her paternal grandmother; Hyperlipidemia in her brother, father, and mother; Ovarian cancer in her maternal aunt; Throat cancer in her father. Social History:  reports that she has never smoked. She has never used smokeless tobacco. She reports that she does not drink alcohol and does not use drugs.     Maternal Diabetes: No Genetic Screening: Normal Maternal Ultrasounds/Referrals: Normal Fetal Ultrasounds or other Referrals:  None Maternal Substance Abuse:  No Significant Maternal Medications:  None Significant Maternal Lab Results:  Group B Strep negative Other Comments:  None  Review of Systems History Dilation: Lip/rim Effacement (%): 100 Station: 0 Exam by:: Madalyn Rob, RN Blood pressure 122/89, pulse (!) 156, temperature 97.8 F (36.6 C), temperature source Oral, resp. rate 18, height 5\' 5"  (1.651 m), weight 70.8 kg, last menstrual period 05/25/2020. Exam Physical Exam  Vitals and nursing note reviewed. Exam conducted with a chaperone present.  Constitutional:      Appearance: Normal appearance.  HENT:     Head: Normocephalic.  Eyes:     Pupils: Pupils are equal, round, and reactive to light.  Cardiovascular:     Rate and Rhythm:  Normal rate and regular rhythm.     Pulses: Normal pulses.  Abdominal:     General: Abdomen is Gravid, nontender Neurological:     Mental Status: She is alert.  ABO, Rh: --/--/B POS (10/11 2325) Antibody: NEG (10/11 2325) Rubella:   RPR:    HBsAg:    HIV:    GBS: Negative/-- (09/19 0000)   Assessment/Plan: IUP at term Active labor Anticipate SVD   09-04-1991 02/26/2021, 3:48 AM

## 2021-02-26 NOTE — Lactation Note (Signed)
This note was copied from a baby's chart. Lactation Consultation Note  Patient Name: Boy Lainy Wrobleski MEBRA'X Date: 02/26/2021 Reason for consult: L&D Initial assessment;Maternal endocrine disorder Age:31 hours  P1, Baby crying off and on during consult.   Baby would latch for a few sucks and stop and cry. Attempted on both breasts.  Placed baby STS to calm baby. Mother states her breasts increased in size slightly during pregnancy and her nipples enlarged. Lacation to follow up on MBU.  Interventions Interventions: Assisted with latch;Skin to skin;Support pillows;Education  Consult Status Consult Status: Follow-up from L&D    Dahlia Byes [facility] 02/26/2021, 9:38 AM

## 2021-02-27 ENCOUNTER — Other Ambulatory Visit (HOSPITAL_COMMUNITY): Payer: Self-pay

## 2021-02-27 LAB — CBC
HCT: 21.5 % — ABNORMAL LOW (ref 36.0–46.0)
Hemoglobin: 7 g/dL — ABNORMAL LOW (ref 12.0–15.0)
MCH: 26.5 pg (ref 26.0–34.0)
MCHC: 32.6 g/dL (ref 30.0–36.0)
MCV: 81.4 fL (ref 80.0–100.0)
Platelets: 201 10*3/uL (ref 150–400)
RBC: 2.64 MIL/uL — ABNORMAL LOW (ref 3.87–5.11)
RDW: 14.1 % (ref 11.5–15.5)
WBC: 18.4 10*3/uL — ABNORMAL HIGH (ref 4.0–10.5)
nRBC: 0 % (ref 0.0–0.2)

## 2021-02-27 MED ORDER — IBUPROFEN 600 MG PO TABS
600.0000 mg | ORAL_TABLET | Freq: Four times a day (QID) | ORAL | 0 refills | Status: AC
  Filled 2021-02-27: qty 30, 8d supply, fill #0

## 2021-02-27 MED ORDER — FERROUS SULFATE 325 (65 FE) MG PO TABS
325.0000 mg | ORAL_TABLET | Freq: Every day | ORAL | 0 refills | Status: AC
  Filled 2021-02-27: qty 30, 30d supply, fill #0

## 2021-02-27 MED ORDER — FERROUS SULFATE 325 (65 FE) MG PO TABS
325.0000 mg | ORAL_TABLET | Freq: Every day | ORAL | Status: DC
  Administered 2021-02-27: 325 mg via ORAL
  Filled 2021-02-27: qty 1

## 2021-02-27 NOTE — Progress Notes (Addendum)
Post Partum Day 1 Subjective: no complaints, up ad lib, voiding, and tolerating PO.  Patient requests discharge home today.  Desires circ.  Objective: Blood pressure 123/78, pulse 61, temperature 97.9 F (36.6 C), temperature source Oral, resp. rate 18, height 5\' 5"  (1.651 m), weight 70.8 kg, last menstrual period 05/25/2020, SpO2 99 %, unknown if currently breastfeeding.  Physical Exam:  General: alert, cooperative, and appears stated age 30: appropriate Uterine Fundus: firm Incision: healing well, no significant drainage, no dehiscence DVT Evaluation: No evidence of DVT seen on physical exam. Negative Homan's sign. No cords or calf tenderness.  Recent Labs    02/25/21 2325 02/27/21 0524  HGB 10.5* 7.0*  HCT 33.1* 21.5*    Assessment/Plan: Discharge home, Breastfeeding, and Circumcision prior to discharge Circ-patient is counseled re: risk of bleeding, infection, scarring.  All questions were answered and we will proceed. Acute blood loss on chronic anemia-FeSO4 started.   LOS: 2 days   03/01/21 02/27/2021, 9:25 AM

## 2021-02-27 NOTE — Discharge Summary (Signed)
Postpartum Discharge Summary  Patient Name: Holly Terry DOB: July 23, 1990 MRN: [id number]  Date of admission: 02/25/2021 Delivery date:02/26/2021  Delivering provider: Dian Queen  Date of discharge: 02/27/2021  Admitting diagnosis: Normal labor [O80, Z37.9] Vacuum extractor delivery, delivered [O75.9] Intrauterine pregnancy: [redacted]w[redacted]d    Secondary diagnosis:  Active Problems:   Normal labor   Vacuum extractor delivery, delivered  Additional problems: none    Discharge diagnosis: Term Pregnancy Delivered                                              Post partum procedures: none Augmentation: N/A Complications: None  Hospital course: Onset of Labor With Vaginal Delivery      30y.o. yo G1P1001 at 357w4das admitted in Active Labor on 02/25/2021. Patient had an uncomplicated labor course as follows:  Membrane Rupture Time/Date: 12:15 AM ,02/26/2021   Delivery Method:Vaginal, Vacuum (Extractor)  Episiotomy: Median  Lacerations:  2nd degree  Patient had an uncomplicated postpartum course.  She is ambulating, tolerating a regular diet, passing flatus, and urinating well. Patient is discharged home in stable condition on 02/27/21.  Newborn Data: Birth date:02/26/2021  Birth time:8:00 AM  Gender:Female  Living status:Living  Apgars:8 ,9  Weight:3850 g   Magnesium Sulfate received: No BMZ received: No Rhophylac:No MMR:No T-DaP:Given prenatally Flu: No Transfusion:No  Physical exam  Vitals:   02/26/21 1417 02/26/21 1802 02/26/21 2200 02/27/21 0616  BP: 116/70 124/83 125/84 123/78  Pulse: (!) 106 73 65 61  Resp:   18 18  Temp: 98.7 F (37.1 C) 98.7 F (37.1 C) 99.2 F (37.3 C) 97.9 F (36.6 C)  TempSrc: Oral Oral Oral Oral  SpO2: 99%     Weight:      Height:       General: alert, cooperative, and no distress Lochia: appropriate Uterine Fundus: firm Incision: Healing well with no significant drainage, No significant erythema DVT Evaluation: No evidence of  DVT seen on physical exam. Negative Homan's sign. No cords or calf tenderness. Labs: Lab Results  Component Value Date   WBC 18.4 (H) 02/27/2021   HGB 7.0 (L) 02/27/2021   HCT 21.5 (L) 02/27/2021   MCV 81.4 02/27/2021   PLT 201 02/27/2021   No flowsheet data found. Edinburgh Score: Edinburgh Postnatal Depression Scale Screening Tool 02/26/2021  I have been able to laugh and see the funny side of things. 0  I have looked forward with enjoyment to things. 0  I have blamed myself unnecessarily when things went wrong. 1  I have been anxious or worried for no good reason. 1  I have felt scared or panicky for no good reason. 1  Things have been getting on top of me. 1  I have been so unhappy that I have had difficulty sleeping. 0  I have felt sad or miserable. 0  I have been so unhappy that I have been crying. 0  The thought of harming myself has occurred to me. 0  Edinburgh Postnatal Depression Scale Total 4     After visit meds:  Allergies as of 02/27/2021   No Known Allergies      Medication List     STOP taking these medications    diclofenac 50 MG EC tablet Commonly known as: VOLTAREN   polyethylene glycol 17 g packet Commonly known as: MIRALAX / GLYCOLAX  TAKE these medications    ferrous sulfate 325 (65 FE) MG tablet Take 1 tablet (325 mg total) by mouth daily with breakfast. Start taking on: February 28, 2021   ibuprofen 600 MG tablet Commonly known as: ADVIL Take 1 tablet (600 mg total) by mouth every 6 (six) hours.   levothyroxine 50 MCG tablet Commonly known as: SYNTHROID Take 50 mcg by mouth daily before breakfast. What changed: Another medication with the same name was removed. Continue taking this medication, and follow the directions you see here.   prenatal multivitamin Tabs tablet Take 1 tablet by mouth daily at 12 noon.         Discharge home in stable condition Infant Feeding: Breast Infant Disposition:home with  mother Discharge instruction: per After Visit Summary and Postpartum booklet. Activity: Advance as tolerated. Pelvic rest for 6 weeks.  Diet: routine diet Future Appointments:No future appointments. Follow up Visit: 6 weeks postpartum   02/27/2021 [name], DO

## 2021-02-27 NOTE — Discharge Instructions (Signed)
Call MD for T>100.4, heavy vaginal bleeding, severe abdominal pain, or respiratory distress.  Call office to schedule postpartum visit in 6 weeks.  Pelvic rest x 6 weeks.   

## 2021-03-05 ENCOUNTER — Inpatient Hospital Stay (HOSPITAL_COMMUNITY): Admission: AD | Admit: 2021-03-05 | Payer: 59 | Source: Home / Self Care

## 2021-03-05 ENCOUNTER — Inpatient Hospital Stay (HOSPITAL_COMMUNITY): Payer: 59

## 2021-03-10 ENCOUNTER — Telehealth (HOSPITAL_COMMUNITY): Payer: Self-pay | Admitting: *Deleted

## 2021-03-10 NOTE — Telephone Encounter (Signed)
Attempted hospital discharge follow-up call. Left message for patient to return RN call. Deforest Hoyles, RN, 03/10/21, [phone]

## 2021-04-07 ENCOUNTER — Other Ambulatory Visit (HOSPITAL_COMMUNITY): Payer: Self-pay

## 2021-04-07 MED ORDER — CEPHALEXIN 500 MG PO CAPS
500.0000 mg | ORAL_CAPSULE | Freq: Three times a day (TID) | ORAL | 0 refills | Status: AC
  Filled 2021-04-07: qty 21, 7d supply, fill #0

## 2021-04-07 MED ORDER — LEVOTHYROXINE SODIUM 50 MCG PO TABS
ORAL_TABLET | Freq: Every morning | ORAL | 0 refills | Status: AC
  Filled 2021-04-07: qty 90, 90d supply, fill #0

## 2021-04-08 ENCOUNTER — Other Ambulatory Visit (HOSPITAL_COMMUNITY): Payer: Self-pay

## 2021-04-09 ENCOUNTER — Other Ambulatory Visit (HOSPITAL_COMMUNITY): Payer: Self-pay

## 2021-04-09 DIAGNOSIS — N61 Mastitis without abscess: Secondary | ICD-10-CM | POA: Diagnosis not present

## 2021-04-09 DIAGNOSIS — Z1389 Encounter for screening for other disorder: Secondary | ICD-10-CM | POA: Diagnosis not present

## 2021-04-09 MED ORDER — DICLOXACILLIN SODIUM 500 MG PO CAPS
500.0000 mg | ORAL_CAPSULE | Freq: Four times a day (QID) | ORAL | 0 refills | Status: AC
  Filled 2021-04-09: qty 28, 7d supply, fill #0

## 2021-04-12 DIAGNOSIS — O9122 Nonpurulent mastitis associated with the puerperium: Secondary | ICD-10-CM | POA: Diagnosis not present

## 2021-04-12 DIAGNOSIS — N6489 Other specified disorders of breast: Secondary | ICD-10-CM | POA: Diagnosis not present

## 2021-06-02 DIAGNOSIS — H5213 Myopia, bilateral: Secondary | ICD-10-CM | POA: Diagnosis not present

## 2021-07-28 DIAGNOSIS — E039 Hypothyroidism, unspecified: Secondary | ICD-10-CM | POA: Diagnosis not present

## 2021-07-28 DIAGNOSIS — E782 Mixed hyperlipidemia: Secondary | ICD-10-CM | POA: Diagnosis not present

## 2021-07-28 DIAGNOSIS — E559 Vitamin D deficiency, unspecified: Secondary | ICD-10-CM | POA: Diagnosis not present

## 2021-07-30 ENCOUNTER — Other Ambulatory Visit (HOSPITAL_COMMUNITY): Payer: Self-pay

## 2021-07-30 DIAGNOSIS — E559 Vitamin D deficiency, unspecified: Secondary | ICD-10-CM | POA: Diagnosis not present

## 2021-07-30 DIAGNOSIS — E039 Hypothyroidism, unspecified: Secondary | ICD-10-CM | POA: Diagnosis not present

## 2021-07-30 DIAGNOSIS — E782 Mixed hyperlipidemia: Secondary | ICD-10-CM | POA: Diagnosis not present

## 2021-07-30 MED ORDER — LEVOTHYROXINE SODIUM 50 MCG PO TABS
ORAL_TABLET | ORAL | 3 refills | Status: AC
  Filled 2021-07-30: qty 90, 90d supply, fill #0
  Filled 2021-09-29: qty 90, 90d supply, fill #1

## 2021-07-31 ENCOUNTER — Other Ambulatory Visit (HOSPITAL_COMMUNITY): Payer: Self-pay

## 2021-09-29 ENCOUNTER — Other Ambulatory Visit (HOSPITAL_COMMUNITY): Payer: Self-pay

## 2022-05-20 IMAGING — DX DG KNEE COMPLETE 4+V*R*
4 series · 4 of 4 positions shown · non-contrast
Comparison: None.

CLINICAL DATA: Right knee pain.

EXAM:
RIGHT KNEE - COMPLETE 4+ VIEW

[knee ap]
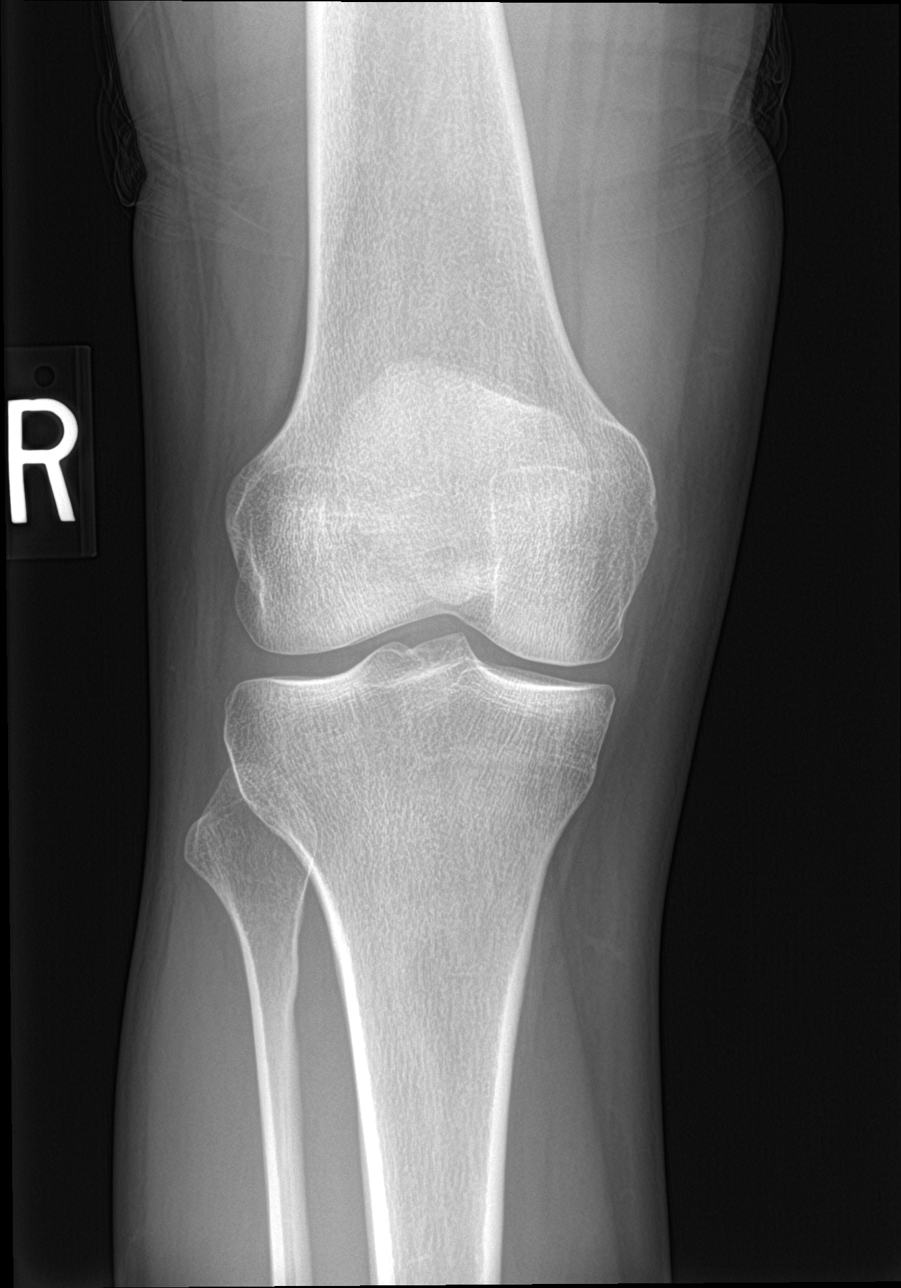

[knee lat]
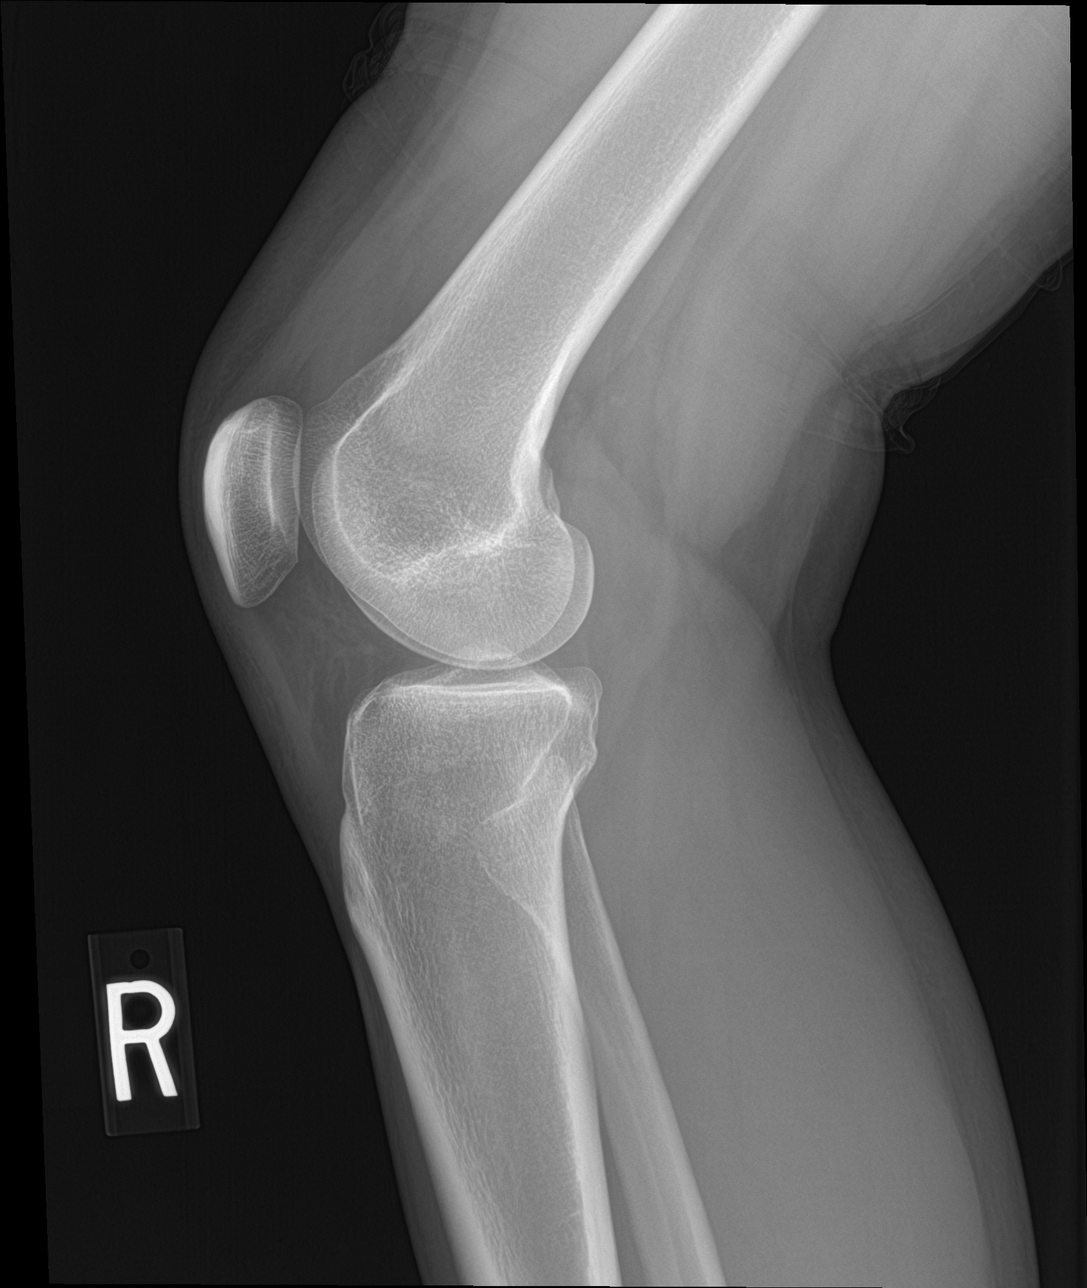

[knee obl (1 of 2)]
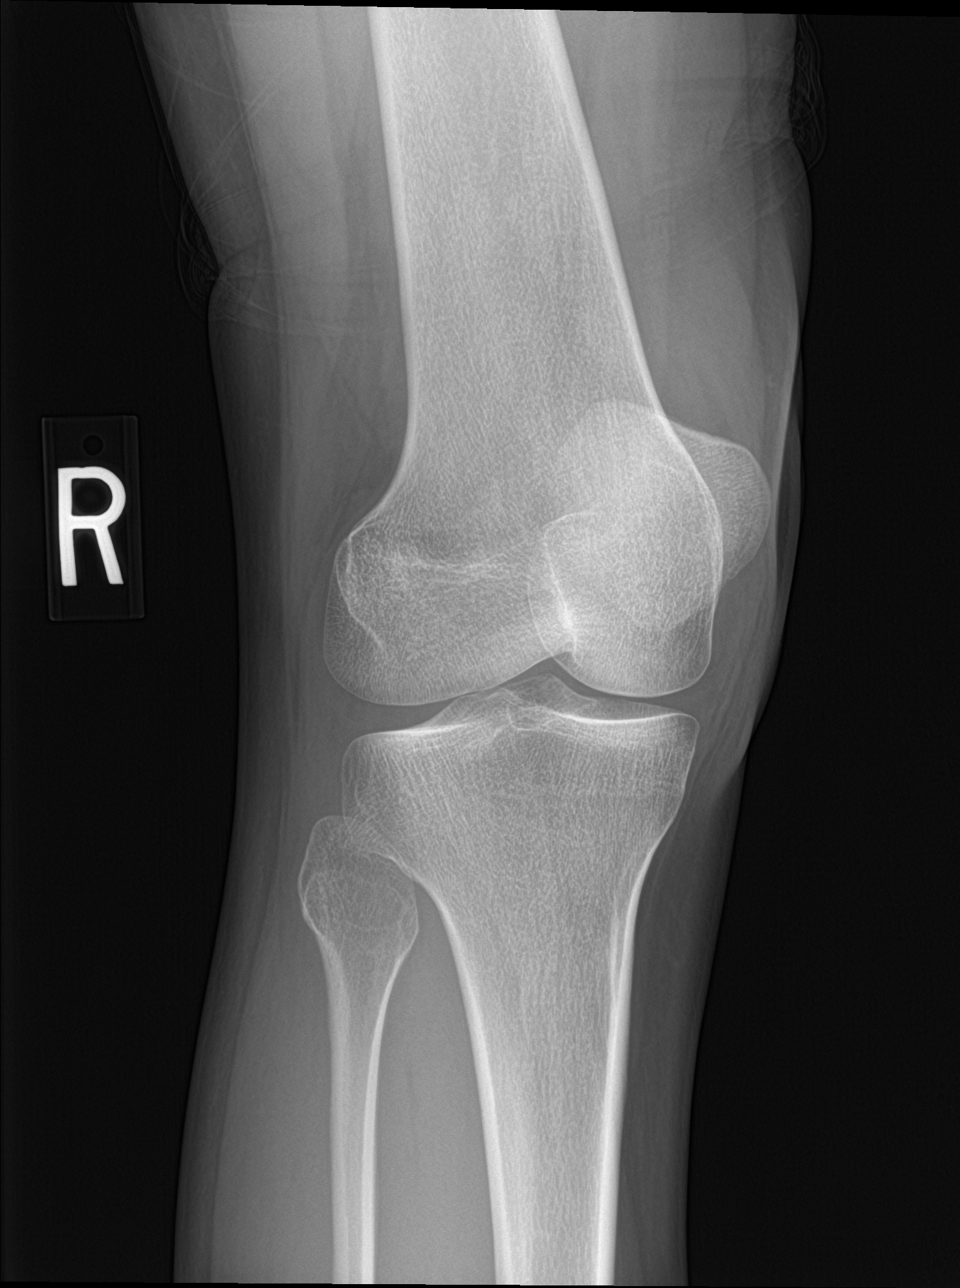

[knee obl (2 of 2)]
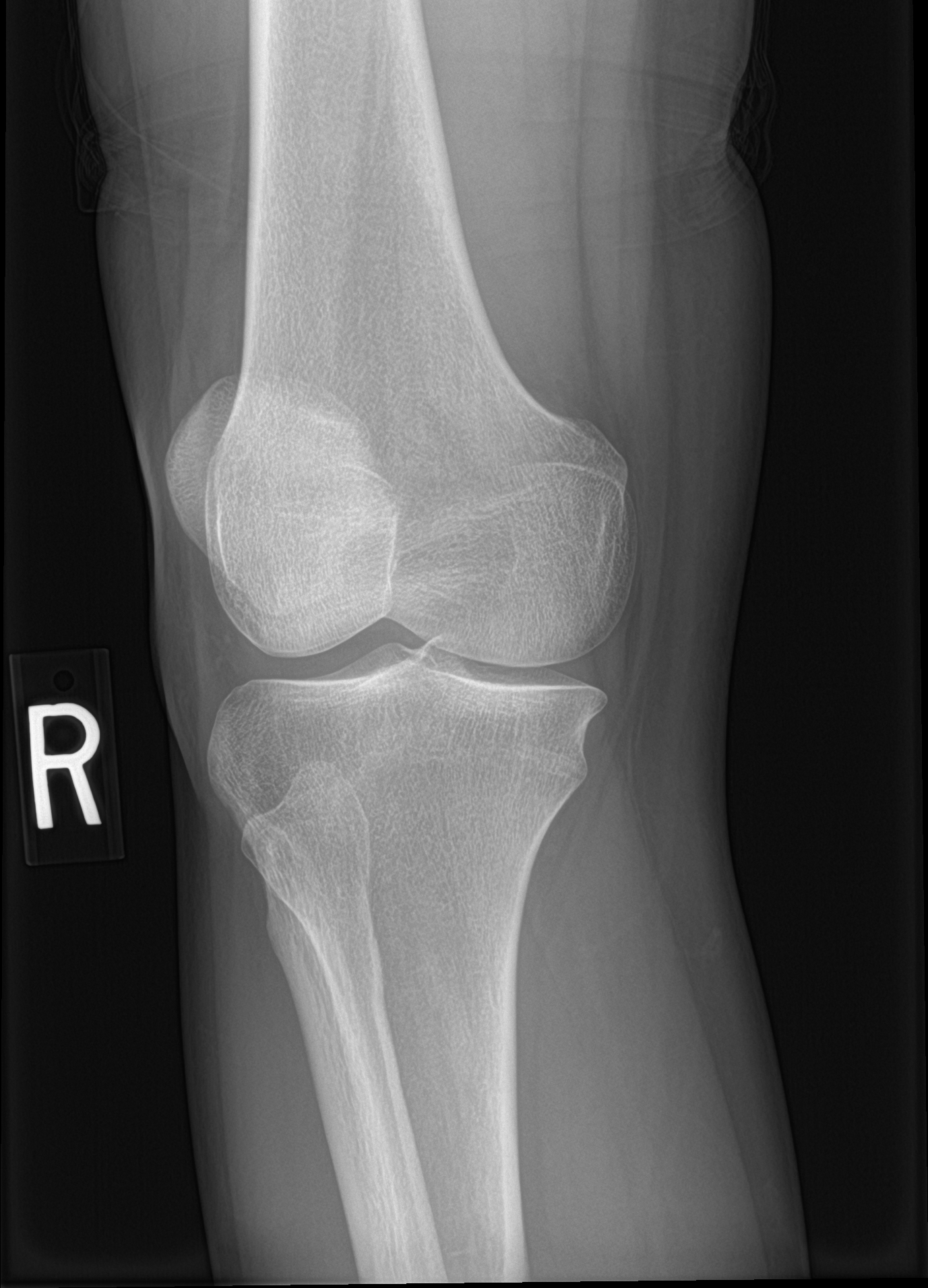

[4 of 4 positions shown; findings below may reference images not displayed]

FINDINGS: No evidence of fracture, dislocation, or joint effusion. No evidence
of arthropathy or other focal bone abnormality. Soft tissues are
unremarkable.
IMPRESSION: Negative.
